# Patient Record
Sex: Female | Born: 1975 | Race: Asian | Hispanic: No | Marital: Married | Smoking: Never smoker
Health system: Southern US, Community
[De-identification: ages and names within clinical notes are randomized; demographics above are authoritative.]

## PROBLEM LIST (undated history)

## (undated) DIAGNOSIS — Z8619 Personal history of other infectious and parasitic diseases: Secondary | ICD-10-CM

## (undated) HISTORY — DX: Personal history of other infectious and parasitic diseases: Z86.19

## (undated) HISTORY — PX: NO PAST SURGERIES: SHX2092

---

## 2003-05-04 ENCOUNTER — Inpatient Hospital Stay (HOSPITAL_COMMUNITY): Admission: AD | Admit: 2003-05-04 | Discharge: 2003-05-04 | Payer: Self-pay | Admitting: Obstetrics and Gynecology

## 2003-05-06 ENCOUNTER — Inpatient Hospital Stay (HOSPITAL_COMMUNITY): Admission: AD | Admit: 2003-05-06 | Discharge: 2003-05-06 | Payer: Self-pay | Admitting: Obstetrics & Gynecology

## 2003-05-08 ENCOUNTER — Inpatient Hospital Stay (HOSPITAL_COMMUNITY): Admission: AD | Admit: 2003-05-08 | Discharge: 2003-05-08 | Payer: Self-pay | Admitting: *Deleted

## 2003-05-08 ENCOUNTER — Encounter: Payer: Self-pay | Admitting: *Deleted

## 2004-02-14 ENCOUNTER — Inpatient Hospital Stay (HOSPITAL_COMMUNITY): Admission: AD | Admit: 2004-02-14 | Discharge: 2004-02-14 | Payer: Self-pay | Admitting: Obstetrics & Gynecology

## 2004-05-15 ENCOUNTER — Ambulatory Visit (HOSPITAL_COMMUNITY): Admission: RE | Admit: 2004-05-15 | Discharge: 2004-05-15 | Payer: Self-pay | Admitting: *Deleted

## 2004-10-10 ENCOUNTER — Inpatient Hospital Stay (HOSPITAL_COMMUNITY): Admission: AD | Admit: 2004-10-10 | Discharge: 2004-10-12 | Payer: Self-pay | Admitting: *Deleted

## 2004-10-10 ENCOUNTER — Ambulatory Visit: Payer: Self-pay | Admitting: Family Medicine

## 2007-05-26 ENCOUNTER — Emergency Department (HOSPITAL_COMMUNITY): Admission: EM | Admit: 2007-05-26 | Discharge: 2007-05-26 | Payer: Self-pay | Admitting: Emergency Medicine

## 2009-03-16 ENCOUNTER — Encounter: Admission: RE | Admit: 2009-03-16 | Discharge: 2009-03-16 | Payer: Self-pay | Admitting: Gastroenterology

## 2010-10-16 ENCOUNTER — Other Ambulatory Visit: Payer: Self-pay | Admitting: Gastroenterology

## 2010-10-16 DIAGNOSIS — R1013 Epigastric pain: Secondary | ICD-10-CM

## 2010-10-23 ENCOUNTER — Ambulatory Visit
Admission: RE | Admit: 2010-10-23 | Discharge: 2010-10-23 | Disposition: A | Discharge status: Home / Self Care | Payer: BC Managed Care – PPO | Source: Ambulatory Visit | Attending: Gastroenterology | Admitting: Gastroenterology

## 2010-10-23 DIAGNOSIS — R1013 Epigastric pain: Secondary | ICD-10-CM

## 2010-12-15 NOTE — H&P (Signed)
Jill Frost, Jill Frost                             ACCOUNT NO.:  [id number]   MEDICAL RECORD NO.:  [id number]                   PATIENT TYPE:  MAT   LOCATION:  [facility]                                 FACILITY:  [facility]   PHYSICIAN:  R[place] B. Earlene Plater, M.D.               DATE OF BIRTH:  1975/11/20   DATE OF ADMISSION:  05/08/2003  DATE OF DISCHARGE:                                HISTORY & PHYSICAL   CHIEF COMPLAINT:  First trimester bleeding and lower abdominal pain.   HISTORY OF PRESENT ILLNESS:  The patient is a 35 year old [place] female,  gravida 2, para 1, A1 with approximately  a 1 week history of lower  abdominal pain in the predominantly right lower quadrant with radiation to  the low back, has been mild to moderate in intensity. She has had associated  light vaginal bleeding. No aggravating or alleviating factors noted.   She has been followed  in maternity admissions with serial quantitative HCG  as follows:  On May 04, 2003, result was 430; May 06, 2003, 569; and  May 08, 2003, 610. A pelvic ultrasound today at [facility]  shows  no intrauterine pregnancy, no adnexal mass, complex fluid in the cul-de-sac  consistent with blood.   PHYSICAL EXAMINATION:  VITAL SIGNS:  Blood pressure 120/73, pulse 83,  respirations 10, temperature 98.  GENERAL:  Alert and oriented, in no acute distress.  SKIN:  No lesions.  ABDOMEN:  Liver and spleen are normal, no hernia, no acute signs noted. Mild  right lower quadrant tenderness  is noted.  PELVIC:  Normal external genitalia, vagina. No active bleeding is seen. The  cervix is closed. Bimanual examination, uterus is not enlarged. There is  mild right lower quadrant tenderness  to bimanual palpation but no masses  palpable.   LABORATORY DATA:  The patient's complete blood count and complete metabolic  profile were checked today  and were within normal limits. Quantitative HCG  today as above. The patient's blood type is noted to  be B positive.   ASSESSMENT:  First trimester bleeding, inappropriate rising, quantitative  HCG trimmed which in combination with ultrasound findings is very suggestive  of ectopic pregnancy. Management  options were discussed  with the patient,  her friend and the language line as interpreters. Explained to the patient  management  options as methotrexate therapy or laparoscopy. The risks of  each were discussed  and the need for  close follow up with repeat  HCGs was  emphasized. Also discussed  the possibly if increasing pain after  methotrexate treatment. If this is not relieved with Tylenol, the patient  was instructed to return immediately  to the MAU. The patient expressed  understanding and desired to proceed with methotrexate treatment. She will  be followed  in maternity admissions with serial quantitative HCGs per the  methotrexate protocol.  Gerri Spore B. Earlene Plater, M.D.    WBD/MEDQ  D:  05/08/2003  T:  05/08/2003  Job:  [id number]

## 2011-05-09 LAB — DIFFERENTIAL
Basophils Absolute: 0
Basophils Relative: 1
Lymphocytes Relative: 41
Lymphs Abs: 1.9
Monocytes Absolute: 0.4
Monocytes Relative: 8
Neutro Abs: 2.3

## 2011-05-09 LAB — CBC
HCT: 38.9
MCHC: 33.7
MCV: 88.9
Platelets: 246

## 2011-05-09 LAB — I-STAT 8, (EC8 V) (CONVERTED LAB)
Acid-Base Excess: 1
BUN: 12
Bicarbonate: 27.3 — ABNORMAL HIGH
Chloride: 105
TCO2: 29
pH, Ven: 7.373 — ABNORMAL HIGH

## 2011-05-09 LAB — POCT I-STAT CREATININE
Creatinine, Ser: 0.7
Operator id: 196461

## 2011-05-09 LAB — URINALYSIS, ROUTINE W REFLEX MICROSCOPIC: Glucose, UA: NEGATIVE

## 2011-05-09 LAB — URINE MICROSCOPIC-ADD ON

## 2014-07-27 LAB — OB RESULTS CONSOLE RPR: RPR: NONREACTIVE

## 2014-07-27 LAB — OB RESULTS CONSOLE HIV ANTIBODY (ROUTINE TESTING): HIV: NONREACTIVE

## 2014-07-27 LAB — OB RESULTS CONSOLE GC/CHLAMYDIA
Chlamydia: NEGATIVE
Gonorrhea: NEGATIVE

## 2014-07-27 LAB — OB RESULTS CONSOLE ABO/RH: RH TYPE: POSITIVE

## 2014-07-27 LAB — OB RESULTS CONSOLE ANTIBODY SCREEN: ANTIBODY SCREEN: NEGATIVE

## 2014-07-27 LAB — OB RESULTS CONSOLE HEPATITIS B SURFACE ANTIGEN: HEP B S AG: NEGATIVE

## 2014-07-27 LAB — OB RESULTS CONSOLE RUBELLA ANTIBODY, IGM: Rubella: IMMUNE

## 2014-07-30 NOTE — L&D Delivery Note (Signed)
Delivery Note At 8:56 PM a viable and healthy female was delivered via Vaginal, Spontaneous Delivery (Presentation: Right Occiput Anterior).  APGAR: 9, 9; weight pending .   Placenta status: Intact, Spontaneous.  Cord: 3 vessels with the following complications: None.    Anesthesia: None  Episiotomy: None Lacerations: None Suture Repair: NA Est. Blood Loss (mL): 200  Mom to postpartum.  Baby to Couplet care / Skin to Skin.  Krystle Polcyn H. 02/04/2015, 9:12 PM

## 2014-12-30 LAB — OB RESULTS CONSOLE GBS: GBS: NEGATIVE

## 2015-02-03 ENCOUNTER — Telehealth (HOSPITAL_COMMUNITY): Payer: Self-pay | Admitting: *Deleted

## 2015-02-03 ENCOUNTER — Encounter (HOSPITAL_COMMUNITY): Payer: Self-pay | Admitting: *Deleted

## 2015-02-03 NOTE — Telephone Encounter (Signed)
Preadmission screen  

## 2015-02-04 ENCOUNTER — Inpatient Hospital Stay (HOSPITAL_COMMUNITY)
Admission: RE | Admit: 2015-02-04 | Discharge: 2015-02-06 | DRG: 775 | Disposition: A | Discharge status: Home / Self Care | Payer: Medicaid Other | Source: Ambulatory Visit | Attending: Obstetrics and Gynecology | Admitting: Obstetrics and Gynecology

## 2015-02-04 ENCOUNTER — Encounter (HOSPITAL_COMMUNITY): Payer: Self-pay

## 2015-02-04 VITALS — BP 89/59 | HR 81 | Temp 98.0°F | Resp 18 | Ht 60.0 in | Wt 128.0 lb

## 2015-02-04 DIAGNOSIS — Z3A40 40 weeks gestation of pregnancy: Secondary | ICD-10-CM | POA: Diagnosis present

## 2015-02-04 DIAGNOSIS — O480 Post-term pregnancy: Principal | ICD-10-CM | POA: Diagnosis present

## 2015-02-04 DIAGNOSIS — O09523 Supervision of elderly multigravida, third trimester: Secondary | ICD-10-CM | POA: Diagnosis not present

## 2015-02-04 LAB — CBC
HCT: 40.7 % (ref 36.0–46.0)
HEMOGLOBIN: 13.5 g/dL (ref 12.0–15.0)
MCH: 28.7 pg (ref 26.0–34.0)
MCHC: 33.2 g/dL (ref 30.0–36.0)
MCV: 86.4 fL (ref 78.0–100.0)
PLATELETS: 208 10*3/uL (ref 150–400)
RBC: 4.71 MIL/uL (ref 3.87–5.11)
RDW: 16.3 % — ABNORMAL HIGH (ref 11.5–15.5)
WBC: 7.8 10*3/uL (ref 4.0–10.5)

## 2015-02-04 LAB — TYPE AND SCREEN
ABO/RH(D): B POS
Antibody Screen: NEGATIVE

## 2015-02-04 LAB — ABO/RH: ABO/RH(D): B POS

## 2015-02-04 MED ORDER — DIPHENHYDRAMINE HCL 25 MG PO CAPS: 25.0000 mg | ORAL_CAPSULE | Freq: Four times a day (QID) | ORAL | Status: DC | PRN

## 2015-02-04 MED ORDER — ACETAMINOPHEN 325 MG PO TABS: 650.0000 mg | ORAL_TABLET | ORAL | Status: DC | PRN

## 2015-02-04 MED ORDER — TERBUTALINE SULFATE 1 MG/ML IJ SOLN
0.2500 mg | Freq: Once | INTRAMUSCULAR | Status: DC | PRN
  Filled 2015-02-04: qty 1

## 2015-02-04 MED ORDER — CITRIC ACID-SODIUM CITRATE 334-500 MG/5ML PO SOLN: 30.0000 mL | ORAL | Status: DC | PRN

## 2015-02-04 MED ORDER — METHYLERGONOVINE MALEATE 0.2 MG/ML IJ SOLN
0.2000 mg | INTRAMUSCULAR | Status: DC | PRN
Start: 2015-02-04 — End: 2015-02-06

## 2015-02-04 MED ORDER — OXYTOCIN 40 UNITS IN LACTATED RINGERS INFUSION - SIMPLE MED
62.5000 mL/h | INTRAVENOUS | Status: DC
  Administered 2015-02-04: 62.5 mL/h via INTRAVENOUS

## 2015-02-04 MED ORDER — LACTATED RINGERS IV SOLN: 500.0000 mL | INTRAVENOUS | Status: DC | PRN

## 2015-02-04 MED ORDER — OXYCODONE-ACETAMINOPHEN 5-325 MG PO TABS: 2.0000 | ORAL_TABLET | ORAL | Status: DC | PRN

## 2015-02-04 MED ORDER — TETANUS-DIPHTH-ACELL PERTUSSIS 5-2.5-18.5 LF-MCG/0.5 IM SUSP
0.5000 mL | Freq: Once | INTRAMUSCULAR | Status: AC
  Administered 2015-02-05: 0.5 mL via INTRAMUSCULAR
  Filled 2015-02-04: qty 0.5

## 2015-02-04 MED ORDER — OXYCODONE-ACETAMINOPHEN 5-325 MG PO TABS
1.0000 | ORAL_TABLET | ORAL | Status: DC | PRN
  Administered 2015-02-04: 1 via ORAL
  Filled 2015-02-04: qty 1

## 2015-02-04 MED ORDER — SENNOSIDES-DOCUSATE SODIUM 8.6-50 MG PO TABS
2.0000 | ORAL_TABLET | ORAL | Status: DC
  Administered 2015-02-05 – 2015-02-06 (×2): 2 via ORAL
  Filled 2015-02-04 (×2): qty 2

## 2015-02-04 MED ORDER — ZOLPIDEM TARTRATE 5 MG PO TABS: 5.0000 mg | ORAL_TABLET | Freq: Every evening | ORAL | Status: DC | PRN

## 2015-02-04 MED ORDER — OXYTOCIN BOLUS FROM INFUSION
500.0000 mL | INTRAVENOUS | Status: DC
  Administered 2015-02-04: 500 mL via INTRAVENOUS

## 2015-02-04 MED ORDER — BUTORPHANOL TARTRATE 1 MG/ML IJ SOLN
1.0000 mg | INTRAMUSCULAR | Status: DC | PRN
  Administered 2015-02-04 (×2): 1 mg via INTRAVENOUS
  Filled 2015-02-04 (×2): qty 1

## 2015-02-04 MED ORDER — WITCH HAZEL-GLYCERIN EX PADS
1.0000 | MEDICATED_PAD | CUTANEOUS | Status: DC | PRN
Start: 2015-02-04 — End: 2015-02-06

## 2015-02-04 MED ORDER — OXYCODONE-ACETAMINOPHEN 5-325 MG PO TABS
1.0000 | ORAL_TABLET | ORAL | Status: DC | PRN
  Administered 2015-02-05 – 2015-02-06 (×2): 1 via ORAL
  Filled 2015-02-04 (×2): qty 1

## 2015-02-04 MED ORDER — BENZOCAINE-MENTHOL 20-0.5 % EX AERO
1.0000 "application " | INHALATION_SPRAY | CUTANEOUS | Status: DC | PRN
  Administered 2015-02-05: 1 via TOPICAL
  Filled 2015-02-04: qty 56

## 2015-02-04 MED ORDER — IBUPROFEN 600 MG PO TABS
600.0000 mg | ORAL_TABLET | Freq: Four times a day (QID) | ORAL | Status: DC
  Administered 2015-02-05 – 2015-02-06 (×7): 600 mg via ORAL
  Filled 2015-02-04 (×7): qty 1

## 2015-02-04 MED ORDER — PRENATAL MULTIVITAMIN CH
1.0000 | ORAL_TABLET | Freq: Every day | ORAL | Status: DC
  Administered 2015-02-05 – 2015-02-06 (×2): 1 via ORAL
  Filled 2015-02-04 (×2): qty 1

## 2015-02-04 MED ORDER — SIMETHICONE 80 MG PO CHEW: 80.0000 mg | CHEWABLE_TABLET | ORAL | Status: DC | PRN

## 2015-02-04 MED ORDER — METHYLERGONOVINE MALEATE 0.2 MG PO TABS
0.2000 mg | ORAL_TABLET | ORAL | Status: DC | PRN
Start: 2015-02-04 — End: 2015-02-06

## 2015-02-04 MED ORDER — LACTATED RINGERS IV SOLN
INTRAVENOUS | Status: DC
  Administered 2015-02-04 (×2): via INTRAVENOUS

## 2015-02-04 MED ORDER — ONDANSETRON HCL 4 MG PO TABS: 4.0000 mg | ORAL_TABLET | ORAL | Status: DC | PRN

## 2015-02-04 MED ORDER — OXYCODONE-ACETAMINOPHEN 5-325 MG PO TABS
2.0000 | ORAL_TABLET | ORAL | Status: DC | PRN
  Administered 2015-02-05: 2 via ORAL
  Filled 2015-02-04 (×2): qty 2

## 2015-02-04 MED ORDER — DIBUCAINE 1 % RE OINT
1.0000 | TOPICAL_OINTMENT | RECTAL | Status: DC | PRN
Start: 2015-02-04 — End: 2015-02-06

## 2015-02-04 MED ORDER — LIDOCAINE HCL (PF) 1 % IJ SOLN
30.0000 mL | INTRAMUSCULAR | Status: DC | PRN
Start: 2015-02-04 — End: 2015-02-04
  Filled 2015-02-04: qty 30

## 2015-02-04 MED ORDER — ONDANSETRON HCL 4 MG/2ML IJ SOLN: 4.0000 mg | INTRAMUSCULAR | Status: DC | PRN

## 2015-02-04 MED ORDER — LANOLIN HYDROUS EX OINT: TOPICAL_OINTMENT | CUTANEOUS | Status: DC | PRN

## 2015-02-04 MED ORDER — OXYTOCIN 40 UNITS IN LACTATED RINGERS INFUSION - SIMPLE MED
1.0000 m[IU]/min | INTRAVENOUS | Status: DC
Start: 2015-02-04 — End: 2015-02-04
  Administered 2015-02-04: 2 m[IU]/min via INTRAVENOUS
  Filled 2015-02-04: qty 1000

## 2015-02-04 MED ORDER — ONDANSETRON HCL 4 MG/2ML IJ SOLN: 4.0000 mg | Freq: Four times a day (QID) | INTRAMUSCULAR | Status: DC | PRN

## 2015-02-04 NOTE — H&P (Signed)
Jill Frost is a 39 y.o. female presenting for IOL for PD  39 yo G3P2002 @ 40+6 presents for IOL for PD. Her pregnancy has been uncomplicated except for AMA and postterm pregnancy History OB History    Gravida Para Term Preterm AB TAB SAB Ectopic Multiple Living   3 2 2       2      Past Medical History  Diagnosis Date  . Hx of varicella    Past Surgical History  Procedure Laterality Date  . No past surgeries     Family History: family history is not on file. Social History:  reports that she has never smoked. She has never used smokeless tobacco. She reports that she does not drink alcohol or use illicit drugs.   Prenatal Transfer Tool  Maternal Diabetes: No Genetic Screening: Normal Maternal Ultrasounds/Referrals: Normal Fetal Ultrasounds or other Referrals:  None Maternal Substance Abuse:  No Significant Maternal Medications:  None Significant Maternal Lab Results:  None Other Comments:  None  ROS  Dilation: 9 Effacement (%): 90 Station: +1 Exam by:: L.Mears,RN Blood pressure 143/90, pulse 105, temperature 98 F (36.7 C), temperature source Oral, resp. rate 20, height 5' (1.524 m), weight 58.06 kg (128 lb), last menstrual period 05/03/2014. Exam Physical Exam  Prenatal labs: ABO, Rh: --/--/B POS, B POS (07/08 0900) Antibody: NEG (07/08 0900) Rubella: Immune (12/29 0000) RPR: Nonreactive (12/29 0000)  HBsAg: Negative (12/29 0000)  HIV: Non-reactive (12/29 0000)  GBS: Negative (06/02 0000)   Assessment/Plan: 1) Admit 2) AROM/Pit 3) Epidural on request   Jill Frost H. 02/04/2015, 9:12 PM

## 2015-02-05 LAB — CBC
HCT: 34.8 % — ABNORMAL LOW (ref 36.0–46.0)
Hemoglobin: 11.3 g/dL — ABNORMAL LOW (ref 12.0–15.0)
MCH: 28 pg (ref 26.0–34.0)
MCHC: 32.5 g/dL (ref 30.0–36.0)
MCV: 86.1 fL (ref 78.0–100.0)
PLATELETS: 182 10*3/uL (ref 150–400)
RBC: 4.04 MIL/uL (ref 3.87–5.11)
RDW: 16.4 % — ABNORMAL HIGH (ref 11.5–15.5)
WBC: 17.1 10*3/uL — ABNORMAL HIGH (ref 4.0–10.5)

## 2015-02-05 LAB — RPR: RPR Ser Ql: NONREACTIVE

## 2015-02-05 NOTE — Lactation Note (Signed)
This note was copied from the chart of Jill Adryanna Olguin. Lactation Consultation Note Experienced BF mom but has been 20 and 10 yrs. Since each child. Mom breast/bottle fed both and plans on doing the same with this baby. Encouraged to put the baby to the breast first before supplementing w/formula. FOB at bedside and interprets what mom doesn't understand. He speaks good english. Mom speaks some english but for some things she didn't understand. Mom encouraged to feed baby 8-12 times/24 hours and with feeding cues. Mom encouraged to waken baby for feeds. Mom encouraged to do skin-to-skin. Educated about newborn behavior, I&O, supply and demand, and supplementing. Referred to Baby and Me Book in Breastfeeding section Pg. 22-23 for position options and Proper latch demonstration.[facility] brochure given w/resources, support groups and LC services. Patient Name: Jill Frost Today's Date: 02/05/2015 Reason for consult: Initial assessment   Maternal Data Has patient been taught Hand Expression?: Yes Does the patient have breastfeeding experience prior to this delivery?: Yes  Feeding Feeding Type: Breast Fed Length of feed: 10 min  LATCH Score/Interventions          Comfort (Breast/Nipple): Soft / non-tender           Lactation Tools Discussed/Used     Consult Status Consult Status: Follow-up Date: 02/06/15 Follow-up type: In-patient    Charyl DancerCARVER, Gilbert Manolis G 02/05/2015, 3:24 PM

## 2015-02-05 NOTE — Progress Notes (Signed)
Post Partum Day 1 Subjective: no complaints, up ad lib, voiding, tolerating PO and + flatus  Objective: Blood pressure 119/63, pulse 75, temperature 98.4 F (36.9 C), temperature source Oral, resp. rate 18, height 5' (1.524 m), weight 58.06 kg (128 lb), last menstrual period 05/03/2014, SpO2 98 %, unknown if currently breastfeeding.  Physical Exam:  General: alert, cooperative and appears stated age Lochia: appropriate Uterine Fundus: firm   Recent Labs  02/04/15 0900 02/05/15 0611  HGB 13.5 11.3*  HCT 40.7 34.8*    Assessment/Plan: Plan for discharge tomorrow   LOS: 1 day   Crysten Kaman H. 02/05/2015, 10:34 AM

## 2015-02-06 MED ORDER — OXYCODONE-ACETAMINOPHEN 5-325 MG PO TABS: 2.0000 | ORAL_TABLET | ORAL | Status: DC | PRN

## 2015-02-06 MED ORDER — IBUPROFEN 600 MG PO TABS: 600.0000 mg | ORAL_TABLET | Freq: Four times a day (QID) | ORAL | Status: DC | PRN

## 2015-02-06 MED ORDER — DOCUSATE SODIUM 100 MG PO CAPS: 100.0000 mg | ORAL_CAPSULE | Freq: Two times a day (BID) | ORAL | Status: DC

## 2015-02-06 NOTE — Discharge Summary (Signed)
Obstetric Discharge Summary Reason for Admission: onset of labor Prenatal Procedures: NST and ultrasound Intrapartum Procedures: spontaneous vaginal delivery Postpartum Procedures: none Complications-Operative and Postpartum: none HEMOGLOBIN  Date Value Ref Range Status  02/05/2015 11.3* 12.0 - 15.0 g/dL Final   HCT  Date Value Ref Range Status  02/05/2015 34.8* 36.0 - 46.0 % Final    Physical Exam:  General: alert, cooperative and appears stated age 72Lochia: appropriate Uterine Fundus: firm  Discharge Diagnoses: Term Pregnancy-delivered  Discharge Information: Date: 02/06/2015 Activity: pelvic rest Diet: routine Medications: Ibuprofen, Colace and Percocet Condition: improved Instructions: refer to practice specific booklet Discharge to: home Follow-up Information    Follow up with Almon HerculesOSS,Kenzo Ozment H., MD In 4 weeks.   Specialty:  Obstetrics and Gynecology   Why:  For a postpartum evaluation   Contact information:   [address] [place] [address] [phone]       Newborn Data: Live born female  Birth Weight: 6 lb 14.8 oz (3140 g) APGAR: 9, 9  Home with mother.  Jill Hebenstreit H. 02/06/2015, 11:45 AM

## 2015-02-06 NOTE — Lactation Note (Signed)
This note was copied from the chart of Girl Carlyle Boteler. Lactation Consultation Note  Patient Name: Girl Jacqualyn Shiner Today's Date: 02/06/2015 Reason for consult: Follow-up assessment  Baby is 6037 hours old and has been to the breast and formula , per mom's choice. Per mom doesn't feel she has enough milk. When showing mom ht e hand pump and having her check size for flange , noted both breast to be full. Mom denies soreness , #24 Flange a good fit. LC discussed with mom and dad that moms breast being full was a good sign, And her milk is coming in and it would be preventive to feed the baby at the breast has much has possible to prevent engorgement. Mom denies sore nipples . Sore nipple and engorgement prevention and tx discussed referring to the Baby and me booklet pages 24. Mother informed of post-discharge support and given phone number to the lactation department, including services for phone call assistance; out-patient appointments; and breastfeeding support group. List of other breastfeeding resources in the community given in the handout. Encouraged mother to call for problems or concerns related to breastfeeding.   Maternal Data Has patient been taught Hand Expression?: Yes  Feeding Feeding Type: Bottle Fed - Formula Nipple Type: Slow - flow  LATCH Score/Interventions                      Lactation Tools Discussed/Used Tools: Pump Breast pump type: Manual (#24 flange a good fit , had mom test it ) WIC Program: Yes (per mom [place] ) Pump Review: Setup, frequency, and cleaning Initiated by:: MAI  Date initiated:: 02/06/15   Consult Status Consult Status: Complete Date: 02/06/15    Kathrin Greathouseorio, Kamren Heskett Ann 02/06/2015, 10:17 AM

## 2015-02-08 ENCOUNTER — Inpatient Hospital Stay (HOSPITAL_COMMUNITY): Admission: RE | Admit: 2015-02-08 | Payer: Self-pay | Source: Ambulatory Visit

## 2016-07-04 ENCOUNTER — Ambulatory Visit
Admission: EM | Admit: 2016-07-04 | Discharge: 2016-07-04 | Disposition: A | Discharge status: Home / Self Care | Payer: BLUE CROSS/BLUE SHIELD | Attending: Emergency Medicine | Admitting: Emergency Medicine

## 2016-07-04 ENCOUNTER — Ambulatory Visit (INDEPENDENT_AMBULATORY_CARE_PROVIDER_SITE_OTHER): Payer: BLUE CROSS/BLUE SHIELD

## 2016-07-04 ENCOUNTER — Encounter: Payer: Self-pay | Admitting: *Deleted

## 2016-07-04 DIAGNOSIS — M5442 Lumbago with sciatica, left side: Secondary | ICD-10-CM

## 2016-07-04 DIAGNOSIS — M5441 Lumbago with sciatica, right side: Secondary | ICD-10-CM

## 2016-07-04 DIAGNOSIS — W102XXA Fall (on)(from) incline, initial encounter: Secondary | ICD-10-CM

## 2016-07-04 DIAGNOSIS — T23322A Burn of third degree of single left finger (nail) except thumb, initial encounter: Secondary | ICD-10-CM | POA: Diagnosis not present

## 2016-07-04 LAB — PREGNANCY, URINE: Preg Test, Ur: NEGATIVE

## 2016-07-04 MED ORDER — ACETAMINOPHEN 500 MG PO TABS: 1000.0000 mg | ORAL_TABLET | Freq: Four times a day (QID) | ORAL | 0 refills | Status: DC | PRN

## 2016-07-04 MED ORDER — SILVER SULFADIAZINE 1 % EX CREA: 1.0000 "application " | TOPICAL_CREAM | Freq: Every day | CUTANEOUS | 0 refills | Status: DC

## 2016-07-04 MED ORDER — IBUPROFEN 800 MG PO TABS: 800.0000 mg | ORAL_TABLET | Freq: Three times a day (TID) | ORAL | 0 refills | Status: DC

## 2016-07-04 MED ORDER — MUPIROCIN 2 % EX OINT: 1.0000 "application " | TOPICAL_OINTMENT | Freq: Two times a day (BID) | CUTANEOUS | 0 refills | Status: DC

## 2016-07-04 MED ORDER — CYCLOBENZAPRINE HCL 5 MG PO TABS: 5.0000 mg | ORAL_TABLET | Freq: Three times a day (TID) | ORAL | 0 refills | Status: DC | PRN

## 2016-07-04 NOTE — ED Triage Notes (Signed)
Patient fell and injured her back 3 months ago. Patient is still having some pain and wants a xray to confirm nothing is broken. Patient burned her left index finger 2 weeks ago. Finger pain had resolved, but pain returned 3 days ago.

## 2016-07-04 NOTE — ED Provider Notes (Signed)
CSN: [id number]     Arrival date & time 07/04/16  1547 History   First MD Initiated Contact with Patient 07/04/16 1614     Chief Complaint  Patient presents with  . Back Pain  . Hand Burn   (Consider location/radiation/quality/duration/timing/severity/associated sxs/prior Treatment) Asian female here for evaluation burn left index finger on oven has been applying burn cream but still bleeding and not healed painful and back pain since falling three months ago wearing heels in the rain and friends child ran in front of her on incline and she slipped and fell on butt.  Able to walk but sitting very painful. Occasionally radiates to thighs currently pain Now between buttocks in cleft. 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 Hx of varicella    Past Surgical History:  Procedure Laterality Date  . NO PAST SURGERIES     History reviewed. No pertinent family history. Social History  Substance Use Topics  . Smoking status: Never Smoker  . Smokeless tobacco: Never Used  . Alcohol use No   OB History    Gravida Para Term Preterm AB Living   3 3 3     3    SAB TAB Ectopic Multiple Live Births         0 3     Review of Systems  Constitutional: Negative for activity change, appetite change, chills, diaphoresis, fatigue and fever.  HENT: Negative for ear pain and sore throat.   Eyes: Negative for pain and visual disturbance.  Respiratory: Negative for cough and shortness of breath.   Cardiovascular: Negative for chest pain and palpitations.  Gastrointestinal: Negative for abdominal pain and vomiting.  Endocrine: Negative for cold intolerance and heat intolerance.  Genitourinary: Negative for difficulty urinating, dysuria, flank pain, frequency and hematuria.  Musculoskeletal: Positive for back pain and myalgias. Negative  for arthralgias, gait problem, joint swelling, neck pain and neck stiffness.  Skin: Positive for wound. Negative for color change, pallor and rash.  Neurological: Negative for seizures and syncope.  Hematological: Negative for adenopathy. Does not bruise/bleed easily.  Psychiatric/Behavioral: Negative for self-injury, sleep disturbance and suicidal ideas.  All other systems reviewed and are negative.   Allergies  Patient has no known allergies.  Home Medications   Prior to Admission medications   Medication Sig Start Date End Date Taking? Authorizing Provider  acetaminophen (TYLENOL) 500 MG tablet Take 2 tablets (1,000 mg total) by mouth every 6 (six) hours as needed. 07/04/16   Barbaraann Barthelina A Dushawn Pusey, NP  cyclobenzaprine (FLEXERIL) 5 MG tablet Take 1 tablet (5 mg total) by mouth 3 (three) times daily as needed for muscle spasms. 07/04/16   Barbaraann Barthelina A Jimmylee Ratterree, NP  ibuprofen (ADVIL,MOTRIN) 800 MG tablet Take 1 tablet (800 mg total) by mouth 3 (three) times daily. 07/04/16   Barbaraann Barthelina A Gerrick Ray, NP  mupirocin ointment (BACTROBAN) 2 % Apply 1 application topically 2 (two) times daily. 07/04/16   Barbaraann Barthelina A Leon Montoya, NP  silver sulfADIAZINE (SILVADENE) 1 % cream Apply 1 application topically daily. 07/04/16   Barbaraann Barthelina A Derrion Tritz, NP   Meds Ordered and Administered this Visit  Medications - No data to display  BP 109/75 (BP Location: Left Arm)   Pulse 80   Temp 98.5 F (36.9 C) (Oral)   Resp 16   Ht 5' (1.524 m)  Wt 115 lb (52.2 kg)   LMP 06/23/2016   SpO2 100%   BMI 22.46 kg/m  No data found.   Physical Exam  Constitutional: She is oriented to person, place, and time. She appears well-developed and well-nourished. She is cooperative.  Non-toxic appearance. She does not have a sickly appearance. She does not appear ill. No distress.  HENT:  Head: Normocephalic and atraumatic.  Right Ear: Hearing, external ear and ear canal normal.  Left Ear: Hearing, external ear and ear canal normal.  Nose:  Nose normal.  Mouth/Throat: Uvula is midline, oropharynx is clear and moist and mucous membranes are normal. No oropharyngeal exudate.  Eyes: Conjunctivae, EOM and lids are normal. Pupils are equal, round, and reactive to light. Right eye exhibits no discharge. Left eye exhibits no discharge. Right conjunctiva is not injected. Left conjunctiva is not injected. No scleral icterus. Right eye exhibits normal extraocular motion and no nystagmus. Left eye exhibits normal extraocular motion and no nystagmus.  Neck: Trachea normal, normal range of motion and phonation normal. Neck supple. No tracheal tenderness, no spinous process tenderness and no muscular tenderness present. No neck rigidity. No tracheal deviation, no edema, no erythema and normal range of motion present. No thyromegaly present.  Cardiovascular: Normal rate, regular rhythm, normal heart sounds and intact distal pulses.   No murmur heard. Pulses:      Radial pulses are 2+ on the right side, and 2+ on the left side.  Pulmonary/Chest: Effort normal and breath sounds normal. No stridor. No respiratory distress.  Abdominal: Soft. There is no tenderness.  Musculoskeletal: Normal range of motion. She exhibits tenderness. She exhibits no edema or deformity.       Right shoulder: Normal.       Left shoulder: Normal.       Right elbow: Normal.      Left elbow: Normal.       Right wrist: Normal.       Left wrist: Normal.       Right hip: Normal.       Left hip: Normal.       Right knee: Normal.       Left knee: Normal.       Right ankle: Normal.       Left ankle: Normal.       Cervical back: Normal.       Thoracic back: Normal.       Lumbar back: She exhibits tenderness, bony tenderness and pain. She exhibits normal range of motion, no swelling, no edema, no deformity, no laceration, no spasm and normal pulse.       Back:       Right hand: Normal.       Left hand: Normal.  Lymphadenopathy:       Head (right side): No submental, no  submandibular, no tonsillar, no preauricular, no posterior auricular and no occipital adenopathy present.       Head (left side): No submental, no submandibular, no tonsillar, no preauricular, no posterior auricular and no occipital adenopathy present.    She has no cervical adenopathy.  Neurological: She is alert and oriented to person, place, and time. She has normal strength. She displays no atrophy and no tremor. No cranial nerve deficit or sensory deficit. She exhibits normal muscle tone. She displays no seizure activity. Coordination and gait normal. GCS eye subscore is 4. GCS verbal subscore is 5. GCS motor subscore is 6.  Reflex Scores:      Patellar reflexes are  2+ on the right side and 2+ on the left side. Hand grasp equal bilaterally 5/5; on/off exam table without difficulty; sitting on exam table when I entered room  Skin: Skin is warm and dry. Capillary refill takes less than 2 seconds. Burn and rash noted. No abrasion, no bruising, no ecchymosis, no laceration, no lesion, no petechiae and no purpura noted. Rash is maculopapular. Rash is not nodular, not pustular, not vesicular and not urticarial. She is not diaphoretic. There is erythema. No cyanosis. No pallor. Nails show no clubbing.  Brown scab 1cm diameter left index finger MCP adjacent pinpoint bleeding and dry scaling around scab  Psychiatric: She has a normal mood and affect. Her speech is normal and behavior is normal. Judgment and thought content normal. She is not actively hallucinating. Cognition and memory are normal. She is attentive.  Nursing note and vitals reviewed.   Urgent Care Course   Clinical Course     Procedures (including critical care time)  Labs Review Labs Reviewed  PREGNANCY, URINE    Imaging Review Dg Sacrum/coccyx  Result Date: 07/04/2016 CLINICAL DATA:  Initial evaluation for fall 3 months ago, with persistent back pain. EXAM: SACRUM AND COCCYX - 2+ VIEW COMPARISON:  None. FINDINGS: No acute  fracture or dislocation. The sacrum and coccyx intact. SI joints approximated. Visualized lower lumbar spine within normal limits. Bony pelvis intact. IMPRESSION: No acute fracture or dislocation identified about the sacrum/ coccyx. Electronically Signed   By: Rise Mu M.D.   On: 07/04/2016 17:14    Notified of xray and lab results at 1725 given copy radiology report.  Patient and son verbalized understanding information and had no further questions at this time.   MDM   1. Fall (on)(from) incline, initial encounter   2. Midline low back pain with bilateral sciatica, unspecified chronicity   3. Burn of third degree of single left finger (nail) except thumb, initial encounter    For acute pain, rest, and intermittent application of heat (do not sleep on heating pad).  I discussed longer-term treatment plan of PRN PO NSAIDS and I discussed a home back care exercise program with a strengthening and flexibility exercise.  Patient given Exitcare handout on lumbago with sciatica.  Proper avoidance of heavy lifting discussed.  Work excuse given for 72 hours today -05 Jul 2016 Consider physical therapy or chiropractic care and radiology if not improving. Rx flexeril 5mg  po TID prn pain.  Avoid alcohol and driving when taking medication discussed it can cause drowsienss and drop in blood pressure.  Motrin 800mg  po TID or tylenol 1000mg  po QID prn pain.  Cryotherapy or heat therapy 15 minutes TID. Call or return to clinic as needed if these symptoms worsen or fail to improve as anticipated especially leg weakness, loss of bowel/bladder control or saddle paresthesias.   Patient verbalized understanding of instructions/information and agreed with plan of care.  P2:  Injury Prevention, fitness  For acute pain, rest, and intermittent application of heat (do not sleep on heating pad).  I discussed longer-term treatment plan of PRN PO NSAIDS motrin 800mg  po TID Rx given and I discussed a home back care  exercise program with a strengthening and flexibility exercise.  Patient given Exitcare handout on tailbone pain.  Proper avoidance of heavy lifting discussed. Recommended gentle stretching, AROM, avoid prolonged sitting/standing or lying in bed.   Consider use of donut pillow. Consider physical therapy or chiropractic care and further imaging if not improving over next two weeks.  Call or return to clinic as needed if these symptoms worsen or fail to improve as anticipated especially leg weakness, loss of bowel/bladder control or saddle paresthesias.   Patient verbalized understanding of instructions/information and agreed with plan of care.  P2:  Injury Prevention, fitness  Patient to start motrin 800mg  po TID, keep affected area covered with tegaderm after applying burn cream or mupirocin BID.  Wear gloves at work. Avoid repetitive immersing in dirty water without gloves.  Change dressing BID or when soiled.  Monitor for red streaks up arm to elbow, fever greater than 100.23F, purulent discharge and seek re-evaluation if this occurs.  Patient verbalized understanding, agreed with plan of care and had no further questions at this time.        Barbaraann Barthel, NP 07/04/16 1732

## 2016-07-07 ENCOUNTER — Telehealth: Payer: Self-pay

## 2017-02-12 ENCOUNTER — Ambulatory Visit
Admission: RE | Admit: 2017-02-12 | Discharge: 2017-02-12 | Disposition: A | Discharge status: Home / Self Care | Payer: BLUE CROSS/BLUE SHIELD | Source: Ambulatory Visit | Attending: Cardiovascular Disease | Admitting: Cardiovascular Disease

## 2017-02-12 ENCOUNTER — Other Ambulatory Visit: Payer: Self-pay | Admitting: Cardiovascular Disease

## 2017-02-12 DIAGNOSIS — M79672 Pain in left foot: Secondary | ICD-10-CM

## 2017-07-24 IMAGING — CR DG FOOT COMPLETE 3+V*L*
3 series · 3 of 3 positions shown · non-contrast
Comparison: None.

CLINICAL DATA: 41-year-old female with metatarsal pain. Palpable
abnormality between the fourth and fifth metatarsals present for
about 5 years.

EXAM:
LEFT FOOT - COMPLETE 3+ VIEW

[t foot ap left]
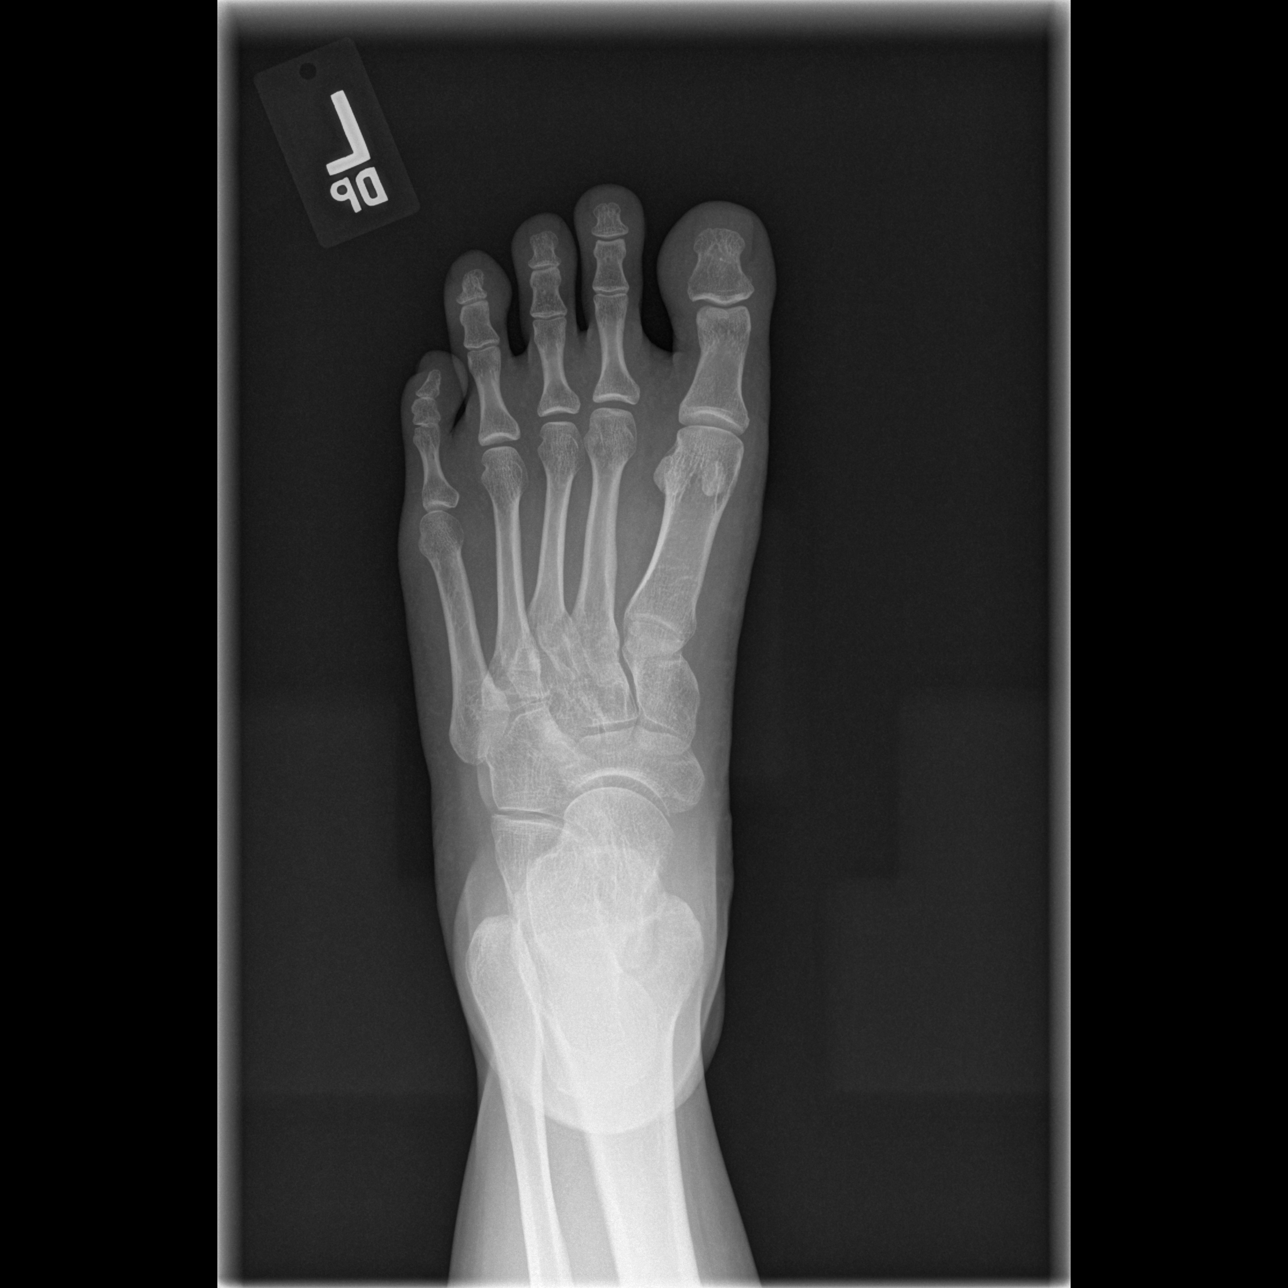

[t foot oblique left]
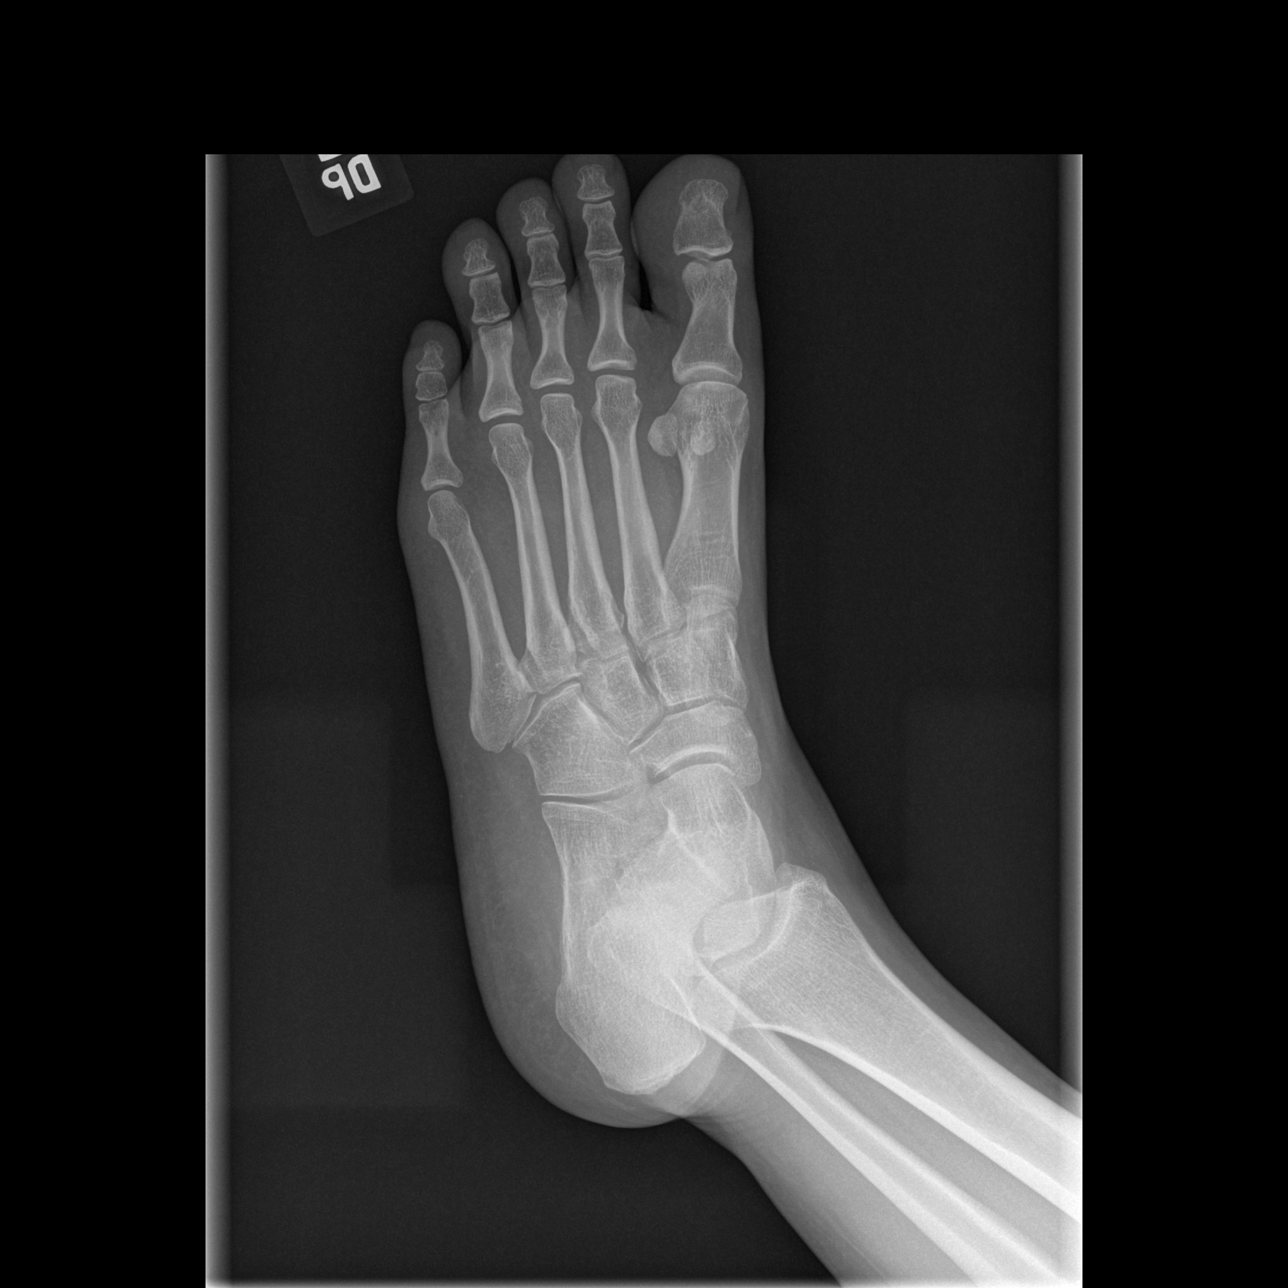

[t foot lat left]
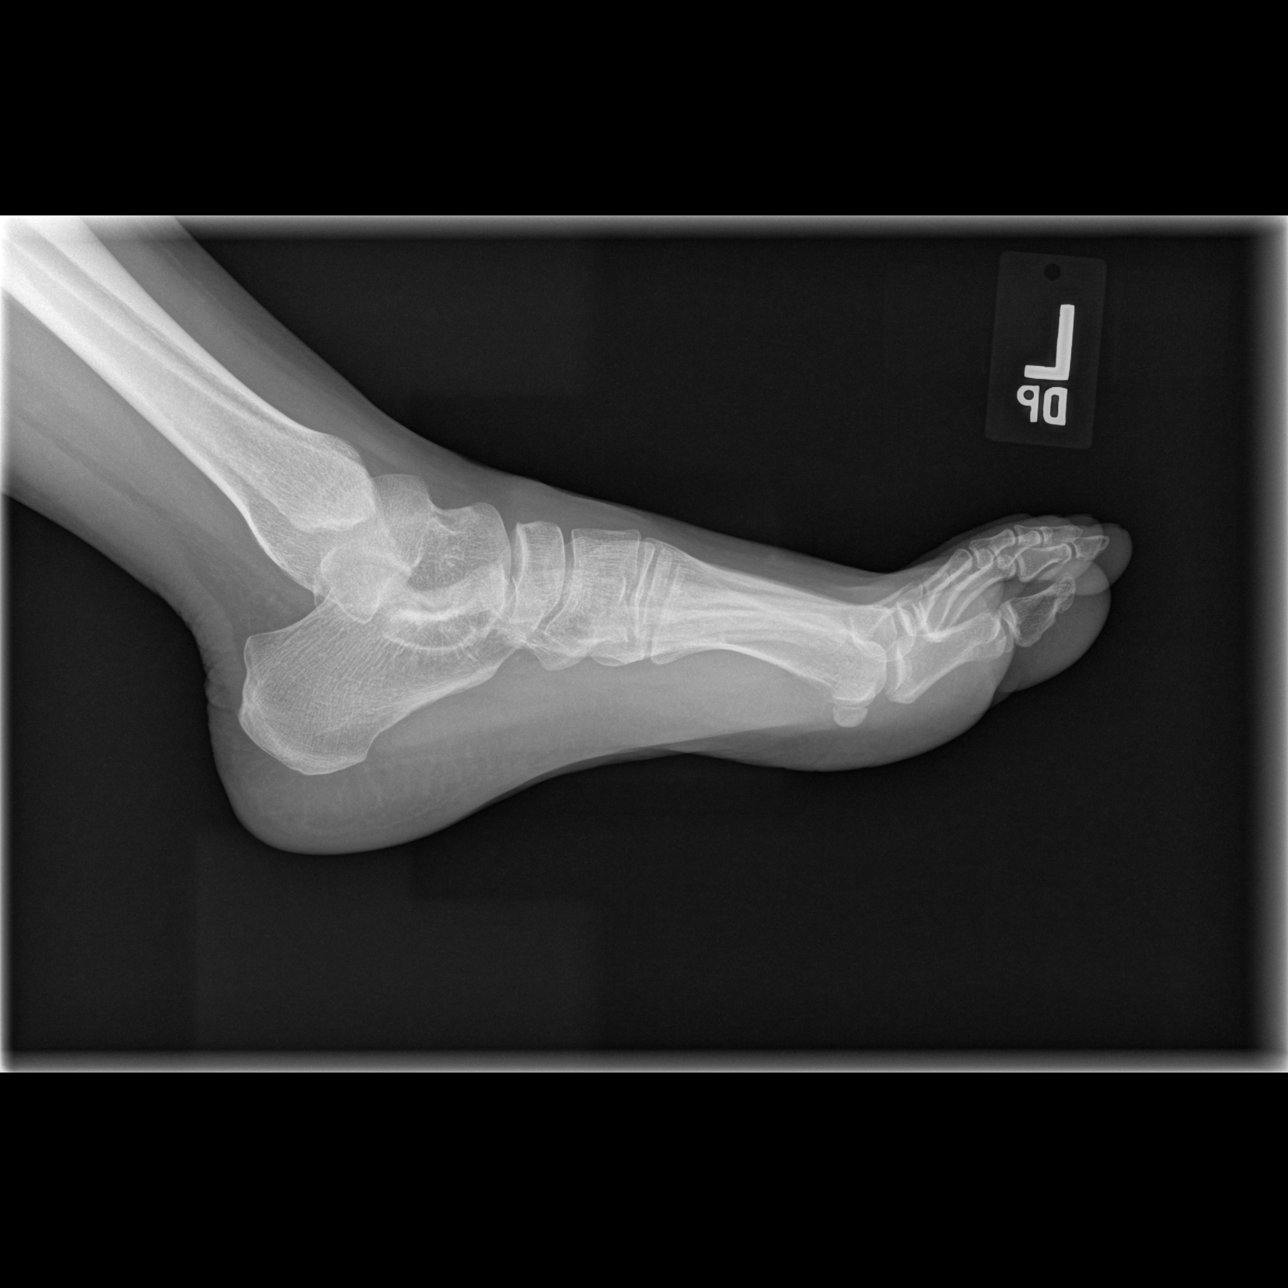

[3 of 3 positions shown; findings below may reference images not displayed]

FINDINGS: Bone mineralization is within normal limits. Joint spaces and
alignment throughout the left foot appear normal. The fourth and
fifth metatarsals appear intact and within normal limits. No
radiopaque foreign body identified. No acute osseous abnormality
identified. No focal soft tissue abnormality is evident.
IMPRESSION: Negative radiographic appearance of the left foot.

## 2018-02-04 ENCOUNTER — Other Ambulatory Visit: Payer: Self-pay | Admitting: Family Medicine

## 2018-02-04 DIAGNOSIS — R1012 Left upper quadrant pain: Secondary | ICD-10-CM

## 2018-02-12 ENCOUNTER — Ambulatory Visit
Admission: RE | Admit: 2018-02-12 | Discharge: 2018-02-12 | Disposition: A | Discharge status: Home / Self Care | Payer: BLUE CROSS/BLUE SHIELD | Source: Ambulatory Visit | Attending: Family Medicine | Admitting: Family Medicine

## 2018-02-12 DIAGNOSIS — R1012 Left upper quadrant pain: Secondary | ICD-10-CM

## 2018-02-12 MED ORDER — IOPAMIDOL (ISOVUE-300) INJECTION 61%
100.0000 mL | Freq: Once | INTRAVENOUS | Status: AC | PRN
  Administered 2018-02-12: 100 mL via INTRAVENOUS

## 2020-10-05 ENCOUNTER — Other Ambulatory Visit: Payer: Self-pay

## 2020-10-05 ENCOUNTER — Ambulatory Visit
Admission: EM | Admit: 2020-10-05 | Discharge: 2020-10-05 | Disposition: A | Discharge status: Home / Self Care | Payer: 59

## 2020-10-05 DIAGNOSIS — K219 Gastro-esophageal reflux disease without esophagitis: Secondary | ICD-10-CM | POA: Diagnosis not present

## 2020-10-05 DIAGNOSIS — M549 Dorsalgia, unspecified: Secondary | ICD-10-CM | POA: Diagnosis not present

## 2020-10-05 DIAGNOSIS — M542 Cervicalgia: Secondary | ICD-10-CM

## 2020-10-05 DIAGNOSIS — R1013 Epigastric pain: Secondary | ICD-10-CM

## 2020-10-05 MED ORDER — METHOCARBAMOL 500 MG PO TABS: 500.0000 mg | ORAL_TABLET | Freq: Three times a day (TID) | ORAL | 0 refills | Status: AC | PRN

## 2020-10-05 MED ORDER — METHYLPREDNISOLONE 4 MG PO TBPK: ORAL_TABLET | ORAL | 0 refills | Status: AC

## 2020-10-05 MED ORDER — OMEPRAZOLE 20 MG PO CPDR: 20.0000 mg | DELAYED_RELEASE_CAPSULE | Freq: Every day | ORAL | 0 refills | Status: AC

## 2020-10-05 NOTE — ED Triage Notes (Signed)
Pt reports having back pain x1 month. No known injury to back. Also reports having abd pain x2 weeks. Denies n/v/d.

## 2020-10-05 NOTE — Discharge Instructions (Signed)
NECK PAIN: Stressed avoiding painful activities. This can exacerbate your symptoms and make them worse.  May apply heat to the areas of pain for some relief. Use medications as directed. Be aware of which medications make you drowsy and do not drive or operate any kind of heavy machinery while using the medication (ie pain medications or muscle relaxers). F/U with PCP for reexamination or return sooner if condition worsens or does not begin to improve over the next few days.   NECK PAIN RED FLAGS: If symptoms get worse than they are right now, you should come back sooner for re-evaluation. If you have increased numbness/ tingling or notice that the numbness/tingling is affecting the legs or saddle region, go to ER. If you ever lose continence go to ER.      BACK PAIN: Stressed avoiding painful activities . RICE (REST, ICE, COMPRESSION, ELEVATION) guidelines reviewed. May alternate ice and heat. Consider use of muscle rubs, Salonpas patches, etc. Use medications as directed including muscle relaxers if prescribed. Take anti-inflammatory medications as prescribed or OTC NSAIDs/Tylenol.  F/u with PCP in 7-10 days for reexamination, and please feel free to call or return to the urgent care at any time for any questions or concerns you may have and we will be happy to help you!   BACK PAIN RED FLAGS: If the back pain acutely worsens or there are any red flag symptoms such as numbness/tingling, leg weakness, saddle anesthesia, or loss of bowel/bladder control, go immediately to the ER. Follow up with [place] as scheduled or sooner if the pain does not begin to resolve or if it worsens before the follow up    ABDOMINAL PAIN: You may take Tylenol for pain relief. Use medications as directed including antiemetics and antidiarrheal medications if suggested or prescribed. You should increase fluids and electrolytes as well as rest over these next several days. If you have any questions or concerns, or if your symptoms are  not improving or if especially if they acutely worsen, please call or stop back to the clinic immediately and we will be happy to help you or go to the ER   ABDOMINAL PAIN RED FLAGS: Seek immediate further care if: symptoms remain the same or worsen over the next 3-7 days, you are unable to keep fluids down, you see blood or mucus in your stool, you vomit black or dark red material, you have a fever of 101.F or higher, you have localized and/or persistent abdominal pain

## 2020-10-05 NOTE — ED Provider Notes (Signed)
[facility]    CSN: [id number] Arrival date & time: 10/05/20  1154      History   Chief Complaint Chief Complaint  Patient presents with  . Back Pain    HPI Viriginia Frost is a 45 y.o. female presenting with multiple complaints.  Patient states that over the past month she has had right-sided neck and upper back pain.  She says it sometimes radiates to her right upper arm.  Denies any associated numbness, weakness or tingling.  Patient says she never had any sort of injury.  No trauma to her neck or back.  Denies similar problem in the past.  Has not taken any medication to help.  Patient says she thinks that it is getting worse.  Denies any chest pain or breathing difficulty.  No cough, fever congestion.  Additionally, patient states that she has had epigastric "burning" abdominal pain over the past 2 to 3 weeks.  Food seems to make the pain worse.  She is not taking any medication to see if it would improve symptoms.  No nausea/vomiting/diarrhea.  No constipation.  Denies any blood in stool or dark stools.  Pain worse at night.  Denies similar problem the past.  Patient does not smoke or drink alcohol.  Admits to about 1 cup of coffee a day.  No other concerns and patient has no significant past medical problems.  HPI  Past Medical History:  Diagnosis Date  . Hx of varicella     Patient Active Problem List   Diagnosis Date Noted  . Post term pregnancy over 40 weeks 02/04/2015  . Spontaneous vaginal delivery 02/04/2015    Past Surgical History:  Procedure Laterality Date  . NO PAST SURGERIES      OB History    Gravida  3   Para  3   Term  3   Preterm      AB      Living  3     SAB      IAB      Ectopic      Multiple  0   Live Births  3            Home Medications    Prior to Admission medications   Medication Sig Start Date End Date Taking? Authorizing Provider  cetirizine (ZYRTEC) 10 MG tablet Take 10 mg by mouth daily.   Yes  [provider]  methocarbamol (ROBAXIN) 500 MG tablet Take 1 tablet (500 mg total) by mouth every 8 (eight) hours as needed for up to 10 days for muscle spasms. 10/05/20 10/15/20 Yes Shirlee Latch, PA-C  methylPREDNISolone (MEDROL DOSEPAK) 4 MG TBPK tablet Take 6 tablets p.o. on day 1 and decrease by 1 tablet daily for the next 5 days 10/05/20  Yes Shirlee Latch, PA-C  omeprazole (PRILOSEC) 20 MG capsule Take 1 capsule (20 mg total) by mouth daily. 10/05/20 11/04/20 Yes Shirlee Latch, PA-C    Family History No family history on file.  Social History Social History   Tobacco Use  . Smoking status: Never Smoker  . Smokeless tobacco: Never Used  Substance Use Topics  . Alcohol use: No  . Drug use: No     Allergies   Patient has no known allergies.   Review of Systems Review of Systems  Constitutional: Negative for fatigue and fever.  HENT: Negative for congestion.   Respiratory: Negative for cough and shortness of breath.   Cardiovascular: Negative for chest  pain.  Gastrointestinal: Positive for abdominal pain. Negative for blood in stool, constipation, diarrhea, nausea and vomiting.  Genitourinary: Negative for dysuria and vaginal discharge.  Musculoskeletal: Positive for back pain and neck pain. Negative for arthralgias, joint swelling and neck stiffness.  Neurological: Negative for dizziness, weakness and headaches.     Physical Exam Triage Vital Signs ED Triage Vitals [10/05/20 1210]  Enc Vitals Group     BP 133/86     Pulse Rate 76     Resp 16     Temp 97.7 F (36.5 C)     Temp Source Oral     SpO2 100 %     Weight 129 lb (58.5 kg)     Height 5' (1.524 m)     Head Circumference      Peak Flow      Pain Score 8     Pain Loc      Pain Edu?      Excl. in GC?    No data found.  Updated Vital Signs BP 133/86   Pulse 76   Temp 97.7 F (36.5 C) (Oral)   Resp 16   Ht 5' (1.524 m)   Wt 129 lb (58.5 kg)   LMP 09/23/2020   SpO2 100%   BMI 25.19  kg/m       Physical Exam Vitals and nursing note reviewed.  Constitutional:      General: She is not in acute distress.    Appearance: Normal appearance. She is not ill-appearing or toxic-appearing.  HENT:     Head: Normocephalic and atraumatic.     Nose: Nose normal.     Mouth/Throat:     Mouth: Mucous membranes are moist.     Pharynx: Oropharynx is clear.  Eyes:     General: No scleral icterus.       Right eye: No discharge.        Left eye: No discharge.     Conjunctiva/sclera: Conjunctivae normal.  Cardiovascular:     Rate and Rhythm: Normal rate and regular rhythm.     Heart sounds: Normal heart sounds.  Pulmonary:     Effort: Pulmonary effort is normal. No respiratory distress.     Breath sounds: Normal breath sounds. No wheezing, rhonchi or rales.  Abdominal:     General: Bowel sounds are normal.     Palpations: Abdomen is soft.     Tenderness: There is abdominal tenderness (mild epigastric TTP).  Musculoskeletal:     Cervical back: Normal range of motion and neck supple. Tenderness (moderate TTP right paracervical muscles and trapezius diffusely) present.     Comments: BACK: TTP right thoracic muscles and right posterior scapular muscles. Full ROM  Skin:    General: Skin is dry.  Neurological:     General: No focal deficit present.     Mental Status: She is alert. Mental status is at baseline.     Motor: No weakness.     Gait: Gait normal.  Psychiatric:        Mood and Affect: Mood normal.        Behavior: Behavior normal.        Thought Content: Thought content normal.      UC Treatments / Results  Labs (all labs ordered are listed, but only abnormal results are displayed) Labs Reviewed - No data to display  EKG   Radiology No results found.  Procedures Procedures (including critical care time)  Medications Ordered in UC Medications - No  data to display  Initial Impression / Assessment and Plan / UC Course  I have reviewed the triage vital  signs and the nursing notes.  Pertinent labs & imaging results that were available during my care of the patient were reviewed by me and considered in my medical decision making (see chart for details).   1.  Neck and back pain: This is a traumatic pain has been ongoing for about a month.  Patient has not tried any thing to help with symptoms.  Exam is consistent with muscular tenderness.  Advised conservative care at this time with Medrol, Robaxin and Tylenol 3 for pain.  Also encouraged use of heating pad.  Advised stretches.  Advise follow-up with PCP if not getting better over the next 2 weeks or if symptoms worsen.  ED precautions for neck and back pain reviewed patient.  2.  Abdominal pain: Patient has had epigastric pain for a couple of weeks that is worse with food and describes it as burning pain.  Pain worse at night.  Her symptoms and exam are consistent with suspected GERD.  Patient has not tried anything to help with symptoms.  Sent omeprazole and advised dietary changes and increasing rest and fluids.  Advised to follow-up with PCP if not getting better over the next couple weeks or symptoms worsen.  ED precautions for abdominal pain reviewed patient.   Final Clinical Impressions(s) / UC Diagnoses   Final diagnoses:  Cervicalgia  Upper back pain on right side  Epigastric pain  Gastroesophageal reflux disease without esophagitis     Discharge Instructions     NECK PAIN: Stressed avoiding painful activities. This can exacerbate your symptoms and make them worse.  May apply heat to the areas of pain for some relief. Use medications as directed. Be aware of which medications make you drowsy and do not drive or operate any kind of heavy machinery while using the medication (ie pain medications or muscle relaxers). F/U with PCP for reexamination or return sooner if condition worsens or does not begin to improve over the next few days.   NECK PAIN RED FLAGS: If symptoms get worse than  they are right now, you should come back sooner for re-evaluation. If you have increased numbness/ tingling or notice that the numbness/tingling is affecting the legs or saddle region, go to ER. If you ever lose continence go to ER.      BACK PAIN: Stressed avoiding painful activities . RICE (REST, ICE, COMPRESSION, ELEVATION) guidelines reviewed. May alternate ice and heat. Consider use of muscle rubs, Salonpas patches, etc. Use medications as directed including muscle relaxers if prescribed. Take anti-inflammatory medications as prescribed or OTC NSAIDs/Tylenol.  F/u with PCP in 7-10 days for reexamination, and please feel free to call or return to the urgent care at any time for any questions or concerns you may have and we will be happy to help you!   BACK PAIN RED FLAGS: If the back pain acutely worsens or there are any red flag symptoms such as numbness/tingling, leg weakness, saddle anesthesia, or loss of bowel/bladder control, go immediately to the ER. Follow up with us as scheduled or sooner if the pain does not begin to resolve or if it worsens before the follow up    ABDOMINAL PAIN: You may take Tylenol for pain relief. Use medications as directed including antiemetics and antidiarrheal medications if suggested or prescribed. You should increase fluids and electrolytes as well as rest over these next several days. If  you have any questions or concerns, or if your symptoms are not improving or if especially if they acutely worsen, please call or stop back to the clinic immediately and we will be happy to help you or go to the ER   ABDOMINAL PAIN RED FLAGS: Seek immediate further care if: symptoms remain the same or worsen over the next 3-7 days, you are unable to keep fluids down, you see blood or mucus in your stool, you vomit black or dark red material, you have a fever of 101.F or higher, you have localized and/or persistent abdominal pain      ED Prescriptions    Medication Sig Dispense  Auth. Provider   methylPREDNISolone (MEDROL DOSEPAK) 4 MG TBPK tablet Take 6 tablets p.o. on day 1 and decrease by 1 tablet daily for the next 5 days 21 tablet Eusebio Friendly B, PA-C   methocarbamol (ROBAXIN) 500 MG tablet Take 1 tablet (500 mg total) by mouth every 8 (eight) hours as needed for up to 10 days for muscle spasms. 30 tablet Eusebio Friendly B, PA-C   omeprazole (PRILOSEC) 20 MG capsule Take 1 capsule (20 mg total) by mouth daily. 30 capsule Shirlee Latch, PA-C     PDMP not reviewed this encounter.   Shirlee Latch, PA-C 10/05/20 1311

## 2022-10-03 ENCOUNTER — Other Ambulatory Visit: Payer: Self-pay | Admitting: Obstetrics and Gynecology

## 2022-10-03 ENCOUNTER — Ambulatory Visit
Admission: RE | Admit: 2022-10-03 | Discharge: 2022-10-03 | Disposition: A | Discharge status: Home / Self Care | Payer: Self-pay | Source: Ambulatory Visit | Attending: Obstetrics and Gynecology | Admitting: Obstetrics and Gynecology

## 2022-10-03 DIAGNOSIS — T8332XA Displacement of intrauterine contraceptive device, initial encounter: Secondary | ICD-10-CM
